# Patient Record
Sex: Male | Born: 2005 | Race: White | Hispanic: No | Marital: Single | Smoking: Never smoker
Health system: Southern US, Community
[De-identification: ages and names within clinical notes are randomized; demographics above are authoritative.]

## PROBLEM LIST (undated history)

## (undated) DIAGNOSIS — F909 Attention-deficit hyperactivity disorder, unspecified type: Secondary | ICD-10-CM

---
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
## 2024-05-04 ENCOUNTER — Ambulatory Visit: Payer: Self-pay

## 2024-05-04 ENCOUNTER — Other Ambulatory Visit: Payer: Self-pay | Admitting: Occupational Medicine

## 2024-05-04 DIAGNOSIS — Z021 Encounter for pre-employment examination: Secondary | ICD-10-CM
